# Patient Record
Sex: Female | Born: 1935 | Race: White | Hispanic: No
Health system: Southern US, Community
[De-identification: ages and names within clinical notes are randomized; demographics above are authoritative.]

---

## 2004-03-16 ENCOUNTER — Ambulatory Visit: Payer: Self-pay | Admitting: Family Medicine

## 2005-01-24 ENCOUNTER — Ambulatory Visit: Payer: Self-pay | Admitting: Family Medicine

## 2005-05-29 ENCOUNTER — Ambulatory Visit: Payer: Self-pay | Admitting: Family Medicine

## 2005-06-16 ENCOUNTER — Other Ambulatory Visit: Payer: Self-pay

## 2005-06-16 ENCOUNTER — Emergency Department: Payer: Self-pay | Admitting: Emergency Medicine

## 2006-06-05 ENCOUNTER — Ambulatory Visit: Payer: Self-pay | Admitting: Family Medicine

## 2006-12-12 IMAGING — CR DG ABDOMEN 3V
1 series · 4 of 4 positions shown · non-contrast
Comparison: none

REASON FOR EXAM: Abdominal pain ;rm 1
COMMENTS:

[Series 1: view not recorded · 0.17mm/px · 4 of 4 slices shown]
[im 1/4]
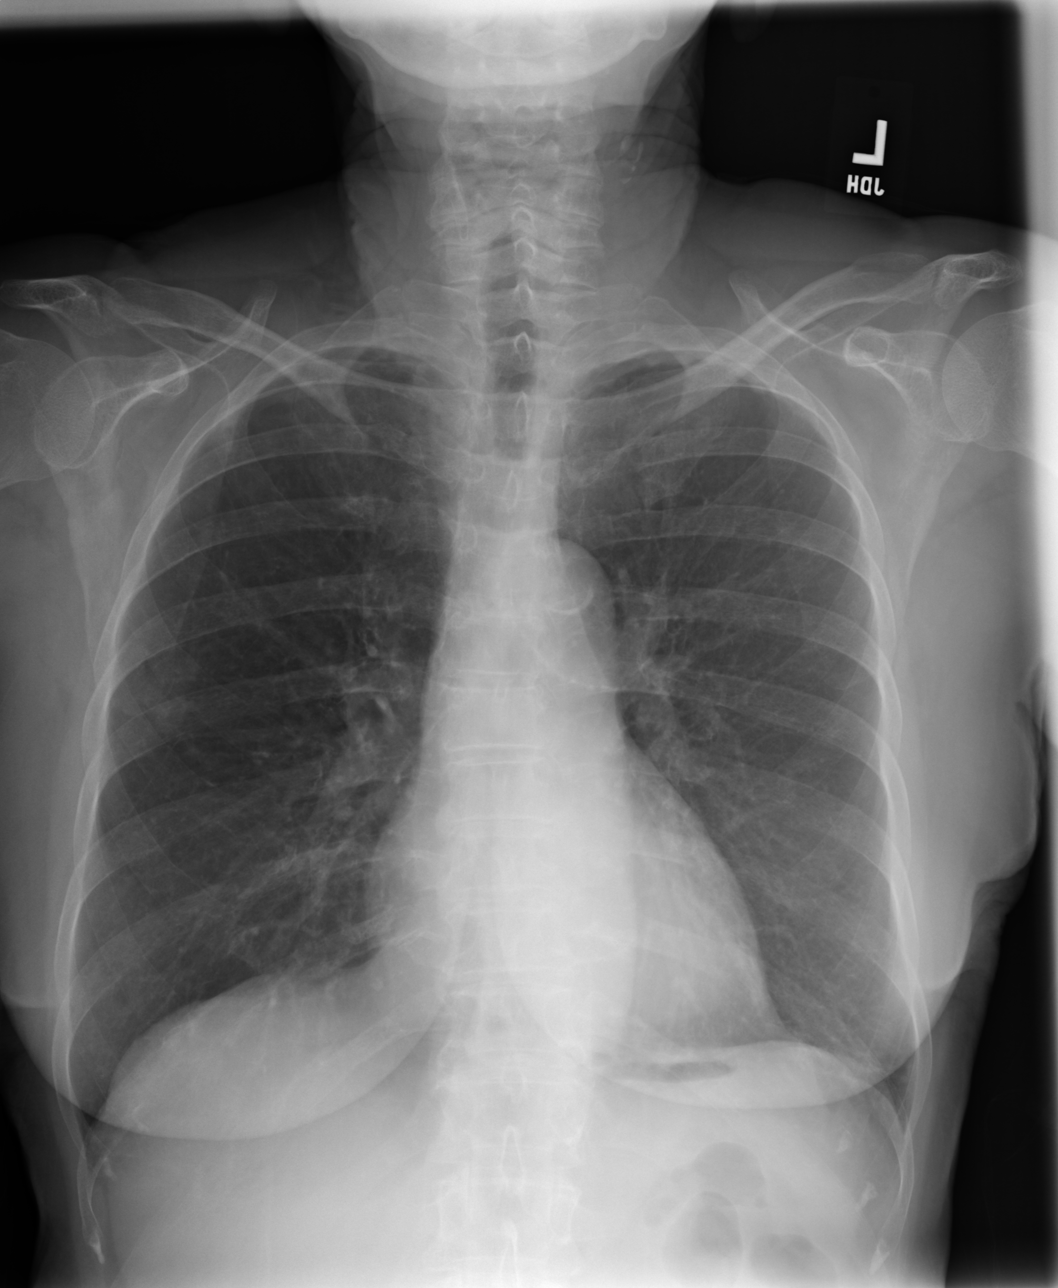
[im 2/4]
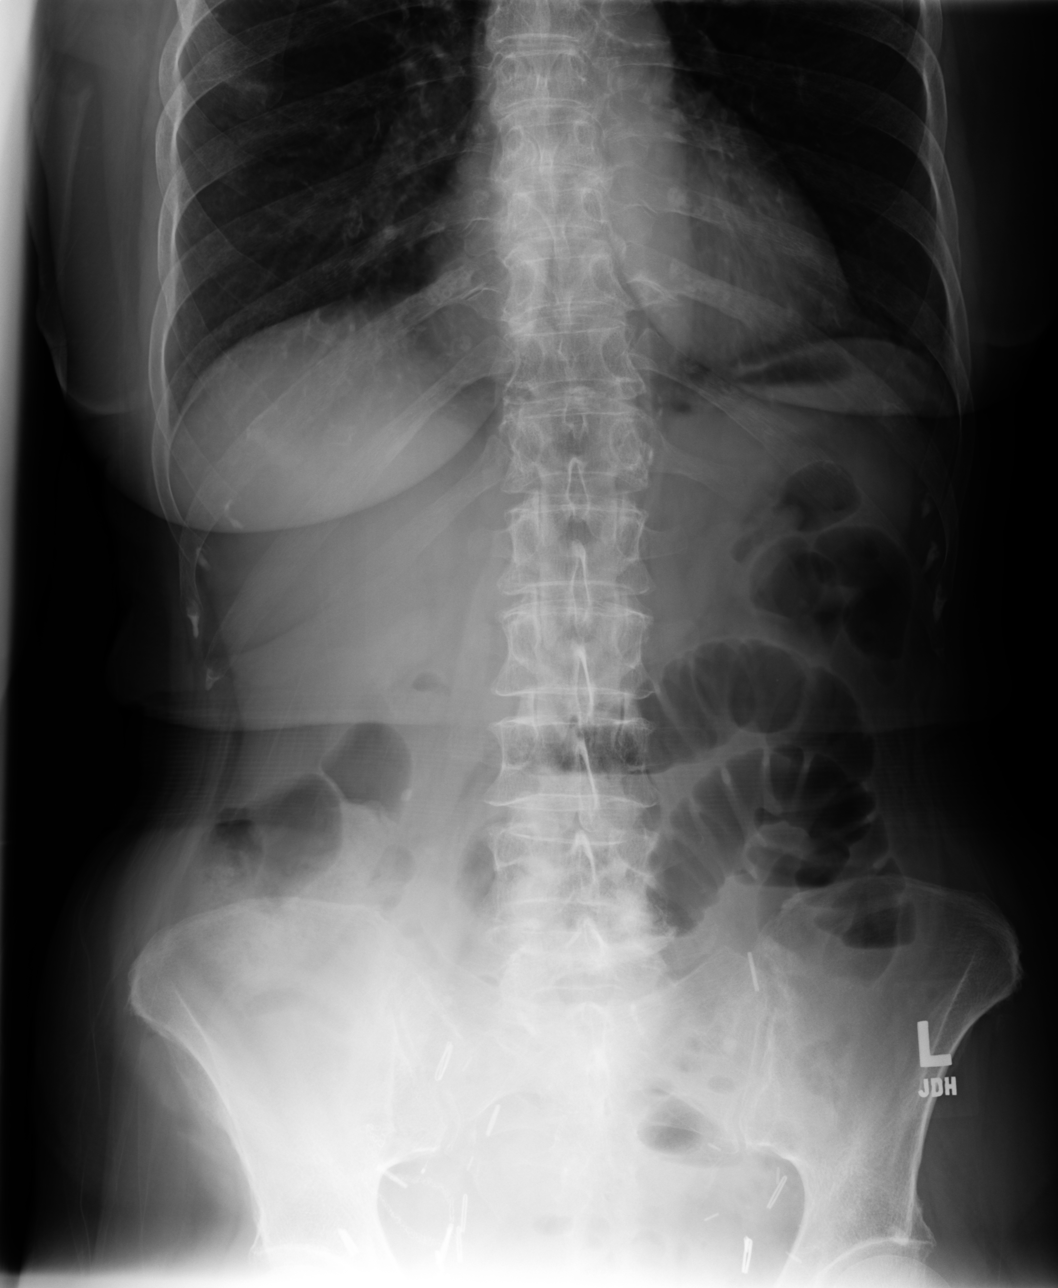
[im 3/4]
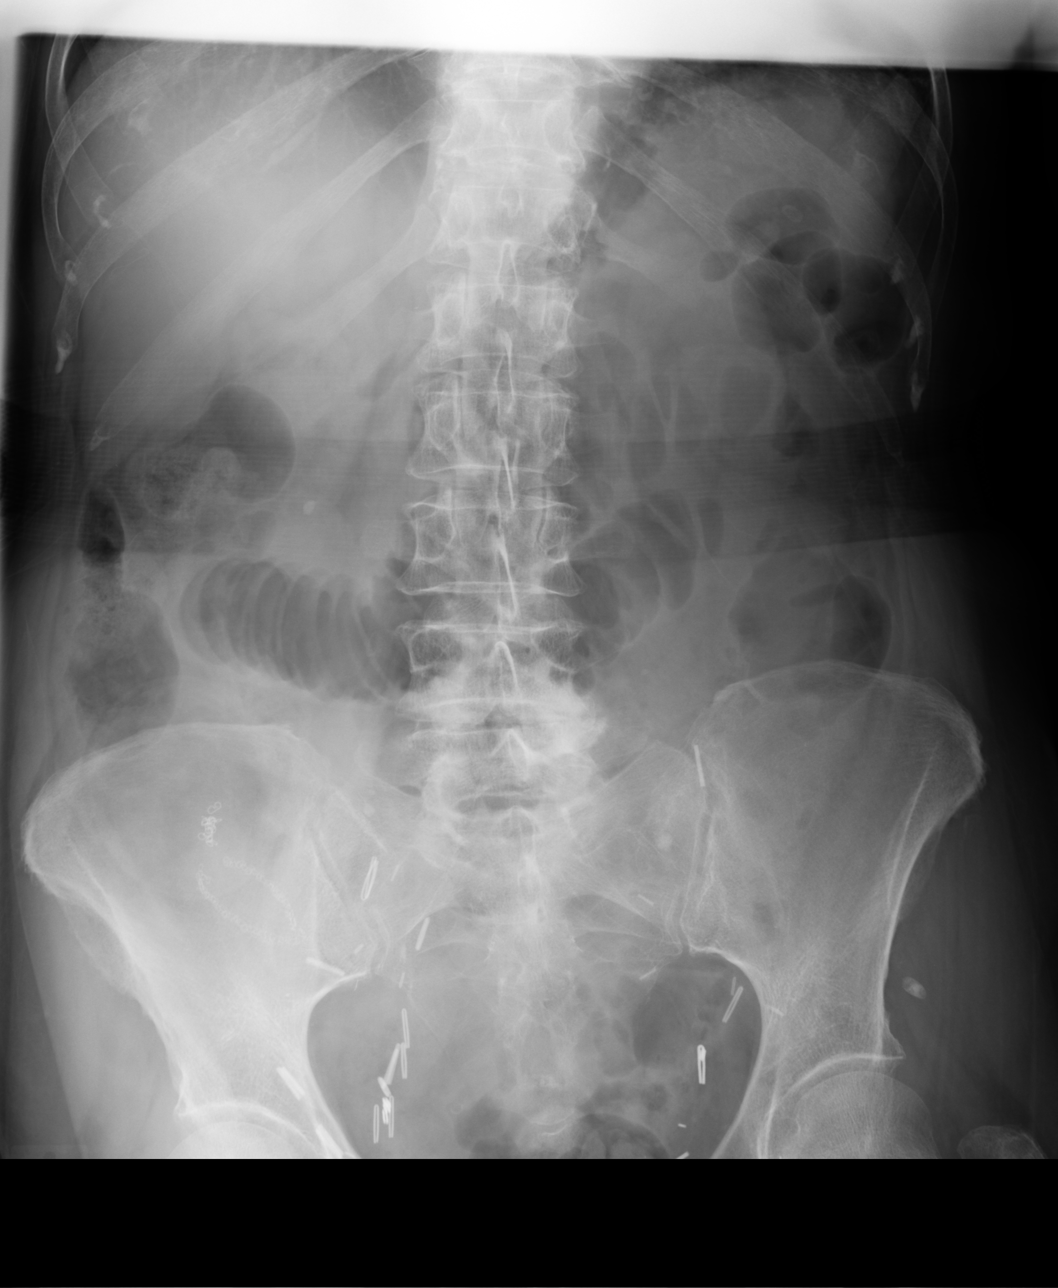
[im 4/4]
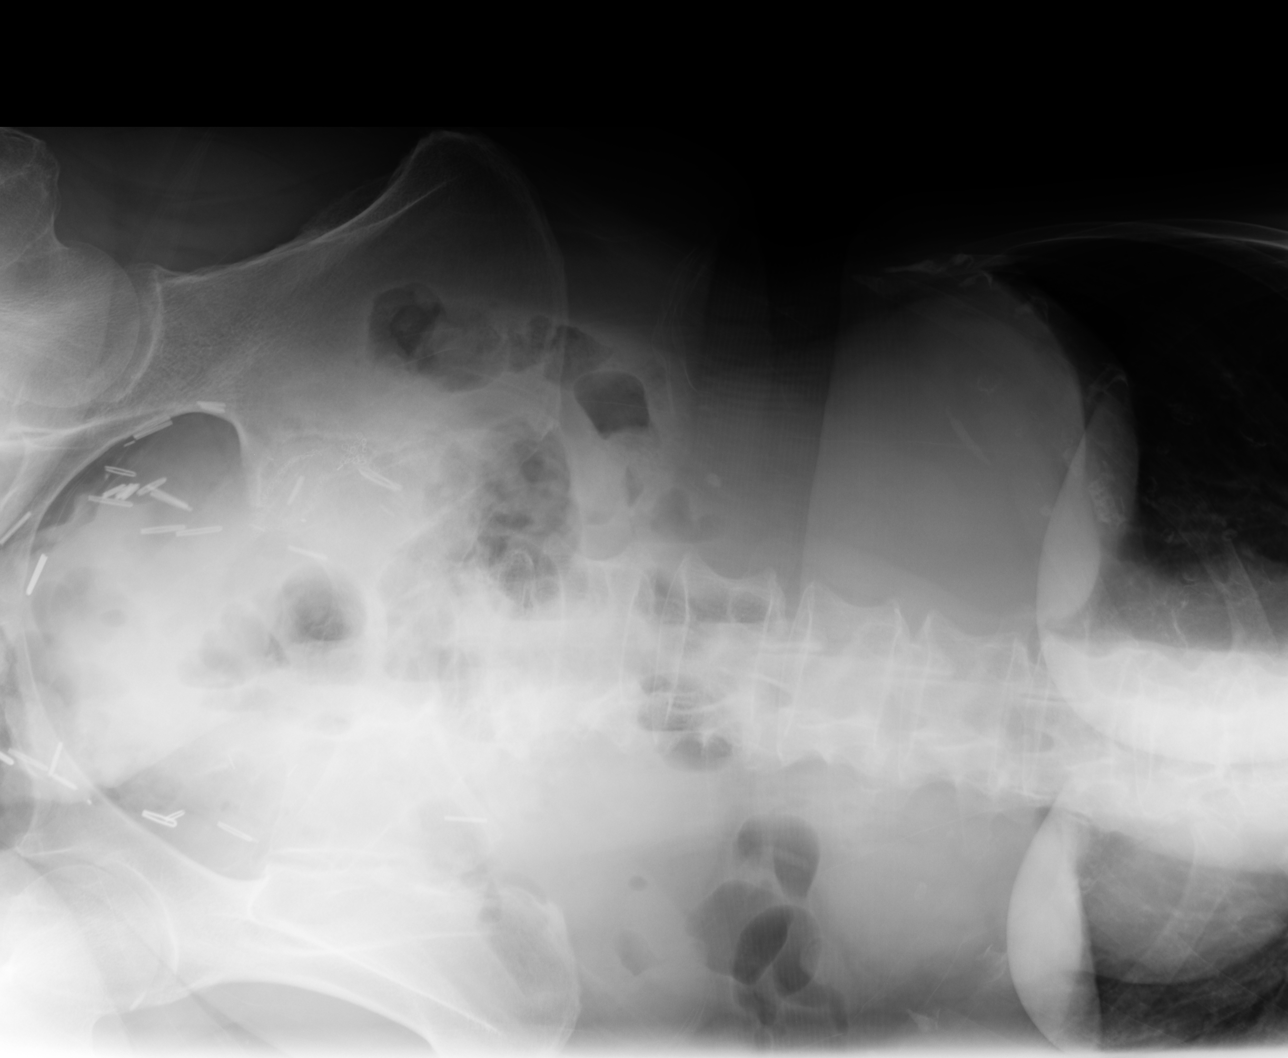

[4 of 4 positions shown; findings below may reference images not displayed]

PROCEDURE:     DXR - DXR ABDOMEN 3-WAY (INCL PA CXR)  - June 16, 2005  [DATE]

RESULT:      There is noted several loops of dilated small bowel in the
midabdomen with air fluid levels.   There is air in the colon down to the
rectosigmoid.   The findings may present an ileus.  No free intraperitoneal
air is noted.  Multiple surgical clips are noted in the pelvis bilaterally.
The accompanying chest film reveals the lung fields to be clear. No
effusions.
IMPRESSION: Findings which can represent an ileus.

## 2007-06-09 ENCOUNTER — Ambulatory Visit: Payer: Self-pay | Admitting: Family Medicine

## 2008-04-14 ENCOUNTER — Emergency Department: Payer: Self-pay | Admitting: Internal Medicine

## 2008-05-02 ENCOUNTER — Emergency Department: Payer: Self-pay | Admitting: Emergency Medicine

## 2008-07-09 ENCOUNTER — Ambulatory Visit: Payer: Self-pay | Admitting: Family Medicine

## 2008-07-20 ENCOUNTER — Ambulatory Visit: Payer: Self-pay | Admitting: Family Medicine

## 2009-08-02 ENCOUNTER — Ambulatory Visit: Payer: Self-pay | Admitting: Family Medicine

## 2009-09-29 ENCOUNTER — Ambulatory Visit: Payer: Self-pay | Admitting: Unknown Physician Specialty

## 2010-08-02 ENCOUNTER — Emergency Department: Payer: Self-pay | Admitting: Emergency Medicine

## 2010-08-17 ENCOUNTER — Ambulatory Visit: Payer: Self-pay | Admitting: Unknown Physician Specialty

## 2011-01-16 ENCOUNTER — Ambulatory Visit: Payer: Self-pay | Admitting: Unknown Physician Specialty

## 2011-04-08 ENCOUNTER — Emergency Department: Payer: Self-pay | Admitting: Emergency Medicine

## 2011-04-08 LAB — BASIC METABOLIC PANEL
Anion Gap: 7 (ref 7–16)
Calcium, Total: 9.6 mg/dL (ref 8.5–10.1)
Chloride: 100 mmol/L (ref 98–107)
Co2: 32 mmol/L (ref 21–32)
EGFR (African American): 48 — ABNORMAL LOW
Sodium: 139 mmol/L (ref 136–145)

## 2011-04-08 LAB — URINALYSIS, COMPLETE
Glucose,UR: NEGATIVE mg/dL (ref 0–75)
Nitrite: NEGATIVE
Protein: NEGATIVE
RBC,UR: 14 /HPF (ref 0–5)
Specific Gravity: 1.011 (ref 1.003–1.030)
WBC UR: 42 /HPF (ref 0–5)

## 2011-04-08 LAB — HEMOGLOBIN: HGB: 11.4 g/dL — ABNORMAL LOW (ref 12.0–16.0)

## 2011-04-10 LAB — URINE CULTURE

## 2011-04-11 ENCOUNTER — Ambulatory Visit: Payer: Self-pay | Admitting: Geriatric Medicine

## 2011-06-13 ENCOUNTER — Ambulatory Visit: Payer: Self-pay | Admitting: Internal Medicine

## 2011-06-28 ENCOUNTER — Observation Stay: Payer: Self-pay | Admitting: Specialist

## 2011-06-28 LAB — URINALYSIS, COMPLETE
Bilirubin,UR: NEGATIVE
Blood: NEGATIVE
Glucose,UR: NEGATIVE mg/dL (ref 0–75)
Hyaline Cast: 1
Leukocyte Esterase: NEGATIVE
Ph: 6 (ref 4.5–8.0)
Protein: 100
WBC UR: 24 /HPF (ref 0–5)

## 2011-06-28 LAB — CK TOTAL AND CKMB (NOT AT ARMC)
CK, Total: 48 U/L (ref 21–215)
CK, Total: 57 U/L (ref 21–215)
CK-MB: <0.5 ng/mL — ABNORMAL LOW (ref 0.5–3.6)

## 2011-06-28 LAB — CBC
HCT: 33.4 % — ABNORMAL LOW (ref 35.0–47.0)
HGB: 11.3 g/dL — ABNORMAL LOW (ref 12.0–16.0)
MCH: 32.5 pg (ref 26.0–34.0)
MCV: 96 fL (ref 80–100)
Platelet: 253 10*3/uL (ref 150–440)
RDW: 14.2 % (ref 11.5–14.5)
WBC: 7.9 10*3/uL (ref 3.6–11.0)

## 2011-06-28 LAB — TROPONIN I
Troponin-I: <0.02 ng/mL
Troponin-I: <0.02 ng/mL

## 2011-06-28 LAB — TSH: Thyroid Stimulating Horm: 0.198 u[IU]/mL — ABNORMAL LOW

## 2011-06-28 LAB — COMPREHENSIVE METABOLIC PANEL
Albumin: 3.5 g/dL (ref 3.4–5.0)
Alkaline Phosphatase: 95 U/L (ref 50–136)
Anion Gap: 6 — ABNORMAL LOW (ref 7–16)
Bilirubin,Total: 0.4 mg/dL (ref 0.2–1.0)
Calcium, Total: 8.7 mg/dL (ref 8.5–10.1)
Chloride: 101 mmol/L (ref 98–107)
Co2: 30 mmol/L (ref 21–32)
EGFR (African American): 34 — ABNORMAL LOW
EGFR (Non-African Amer.): 29 — ABNORMAL LOW
Glucose: 202 mg/dL — ABNORMAL HIGH (ref 65–99)
Potassium: 4.2 mmol/L (ref 3.5–5.1)
SGOT(AST): 24 U/L (ref 15–37)
Sodium: 137 mmol/L (ref 136–145)
Total Protein: 7.2 g/dL (ref 6.4–8.2)

## 2011-06-29 LAB — CBC WITH DIFFERENTIAL/PLATELET
Eosinophil #: 0.1 10*3/uL (ref 0.0–0.7)
Lymphocyte #: 1.7 10*3/uL (ref 1.0–3.6)
Lymphocyte %: 22.4 %
MCH: 32.2 pg (ref 26.0–34.0)
MCHC: 33.3 g/dL (ref 32.0–36.0)
Monocyte #: 0.8 x10 3/mm (ref 0.2–0.9)
Neutrophil #: 5 10*3/uL (ref 1.4–6.5)
Neutrophil %: 65 %
Platelet: 209 10*3/uL (ref 150–440)
RBC: 3.2 10*6/uL — ABNORMAL LOW (ref 3.80–5.20)

## 2011-06-29 LAB — BASIC METABOLIC PANEL
BUN: 36 mg/dL — ABNORMAL HIGH (ref 7–18)
Calcium, Total: 8.4 mg/dL — ABNORMAL LOW (ref 8.5–10.1)
Chloride: 105 mmol/L (ref 98–107)
Co2: 27 mmol/L (ref 21–32)
Creatinine: 1.27 mg/dL (ref 0.60–1.30)
EGFR (Non-African Amer.): 36 — ABNORMAL LOW
Osmolality: 288 (ref 275–301)
Sodium: 140 mmol/L (ref 136–145)

## 2011-06-29 LAB — CK TOTAL AND CKMB (NOT AT ARMC)
CK, Total: 53 U/L (ref 21–215)
CK-MB: <0.5 ng/mL — ABNORMAL LOW (ref 0.5–3.6)

## 2011-06-29 LAB — TROPONIN I: Troponin-I: <0.02 ng/mL

## 2011-07-14 ENCOUNTER — Ambulatory Visit: Payer: Self-pay | Admitting: Internal Medicine

## 2011-07-23 ENCOUNTER — Observation Stay: Payer: Self-pay | Admitting: Internal Medicine

## 2011-07-23 LAB — URINALYSIS, COMPLETE
Bilirubin,UR: NEGATIVE
Blood: NEGATIVE
Ketone: NEGATIVE
Ph: 6 (ref 4.5–8.0)
Squamous Epithelial: NONE SEEN

## 2011-07-23 LAB — COMPREHENSIVE METABOLIC PANEL
Albumin: 3.4 g/dL (ref 3.4–5.0)
Anion Gap: 9 (ref 7–16)
BUN: 53 mg/dL — ABNORMAL HIGH (ref 7–18)
Bilirubin,Total: 0.2 mg/dL (ref 0.2–1.0)
Chloride: 104 mmol/L (ref 98–107)
Creatinine: 2.19 mg/dL — ABNORMAL HIGH (ref 0.60–1.30)
Glucose: 153 mg/dL — ABNORMAL HIGH (ref 65–99)
Osmolality: 293 (ref 275–301)
Potassium: 4.9 mmol/L (ref 3.5–5.1)
SGOT(AST): 21 U/L (ref 15–37)
SGPT (ALT): 20 U/L
Sodium: 138 mmol/L (ref 136–145)
Total Protein: 7 g/dL (ref 6.4–8.2)

## 2011-07-23 LAB — CBC
HCT: 32 % — ABNORMAL LOW (ref 35.0–47.0)
HGB: 10.7 g/dL — ABNORMAL LOW (ref 12.0–16.0)
MCH: 32.1 pg (ref 26.0–34.0)
MCV: 96 fL (ref 80–100)
Platelet: 215 10*3/uL (ref 150–440)
RDW: 13.5 % (ref 11.5–14.5)
WBC: 6 10*3/uL (ref 3.6–11.0)

## 2011-07-23 LAB — TROPONIN I: Troponin-I: <0.02 ng/mL

## 2011-07-23 LAB — CK TOTAL AND CKMB (NOT AT ARMC): CK, Total: 58 U/L (ref 21–215)

## 2011-07-24 LAB — CBC WITH DIFFERENTIAL/PLATELET
Basophil #: 0 10*3/uL (ref 0.0–0.1)
Eosinophil %: 3.1 %
HCT: 29.9 % — ABNORMAL LOW (ref 35.0–47.0)
Lymphocyte #: 1.4 10*3/uL (ref 1.0–3.6)
MCHC: 32.9 g/dL (ref 32.0–36.0)
Monocyte #: 0.5 x10 3/mm (ref 0.2–0.9)
Neutrophil %: 52.4 %
RBC: 3.06 10*6/uL — ABNORMAL LOW (ref 3.80–5.20)
RDW: 13.5 % (ref 11.5–14.5)

## 2011-07-24 LAB — BASIC METABOLIC PANEL
Co2: 20 mmol/L — ABNORMAL LOW (ref 21–32)
EGFR (African American): 32 — ABNORMAL LOW
Glucose: 86 mg/dL (ref 65–99)
Osmolality: 295 (ref 275–301)
Potassium: 4.7 mmol/L (ref 3.5–5.1)

## 2011-07-24 LAB — TSH: Thyroid Stimulating Horm: 0.155 u[IU]/mL — ABNORMAL LOW

## 2011-07-24 LAB — HEMOGLOBIN A1C: Hemoglobin A1C: 6 % (ref 4.2–6.3)

## 2012-05-02 ENCOUNTER — Inpatient Hospital Stay: Payer: Self-pay | Admitting: Internal Medicine

## 2012-05-02 LAB — BASIC METABOLIC PANEL
Anion Gap: 6 — ABNORMAL LOW (ref 7–16)
BUN: 49 mg/dL — ABNORMAL HIGH (ref 7–18)
Calcium, Total: 8.7 mg/dL (ref 8.5–10.1)
Creatinine: 2.46 mg/dL — ABNORMAL HIGH (ref 0.60–1.30)
EGFR (African American): 19 — ABNORMAL LOW
EGFR (Non-African Amer.): 16 — ABNORMAL LOW
Glucose: 150 mg/dL — ABNORMAL HIGH (ref 65–99)
Osmolality: 293 (ref 275–301)
Potassium: 4.7 mmol/L (ref 3.5–5.1)
Sodium: 139 mmol/L (ref 136–145)

## 2012-05-02 LAB — CBC
HCT: 33.1 % — ABNORMAL LOW (ref 35.0–47.0)
MCHC: 33.3 g/dL (ref 32.0–36.0)
MCV: 99 fL (ref 80–100)
RDW: 12.4 % (ref 11.5–14.5)

## 2012-05-02 LAB — URINALYSIS, COMPLETE
Blood: NEGATIVE
Glucose,UR: NEGATIVE mg/dL (ref 0–75)
Ketone: NEGATIVE
Nitrite: NEGATIVE
Protein: 30
Squamous Epithelial: <1
WBC UR: 18 /HPF (ref 0–5)

## 2012-05-03 LAB — CBC WITH DIFFERENTIAL/PLATELET
Basophil #: 0 10*3/uL (ref 0.0–0.1)
Basophil %: 0.6 %
Eosinophil #: 0.2 10*3/uL (ref 0.0–0.7)
Eosinophil %: 2.3 %
HCT: 29.8 % — ABNORMAL LOW (ref 35.0–47.0)
HGB: 10.1 g/dL — ABNORMAL LOW (ref 12.0–16.0)
MCH: 33 pg (ref 26.0–34.0)
MCHC: 33.8 g/dL (ref 32.0–36.0)
MCV: 98 fL (ref 80–100)
Monocyte #: 0.9 x10 3/mm (ref 0.2–0.9)
Neutrophil #: 5.3 10*3/uL (ref 1.4–6.5)
Neutrophil %: 69.4 %
RBC: 3.05 10*6/uL — ABNORMAL LOW (ref 3.80–5.20)
RDW: 12.2 % (ref 11.5–14.5)

## 2012-05-03 LAB — BASIC METABOLIC PANEL
Anion Gap: 9 (ref 7–16)
Calcium, Total: 8.3 mg/dL — ABNORMAL LOW (ref 8.5–10.1)
Chloride: 110 mmol/L — ABNORMAL HIGH (ref 98–107)
Creatinine: 2.02 mg/dL — ABNORMAL HIGH (ref 0.60–1.30)
EGFR (African American): 24 — ABNORMAL LOW
EGFR (Non-African Amer.): 20 — ABNORMAL LOW
Glucose: 86 mg/dL (ref 65–99)
Sodium: 144 mmol/L (ref 136–145)

## 2012-05-03 LAB — WBCS, STOOL

## 2012-05-03 LAB — CLOSTRIDIUM DIFFICILE BY PCR

## 2012-05-05 LAB — BASIC METABOLIC PANEL
Anion Gap: 8 (ref 7–16)
BUN: 39 mg/dL — ABNORMAL HIGH (ref 7–18)
Calcium, Total: 7.7 mg/dL — ABNORMAL LOW (ref 8.5–10.1)
Chloride: 115 mmol/L — ABNORMAL HIGH (ref 98–107)
Co2: 21 mmol/L (ref 21–32)
EGFR (African American): 33 — ABNORMAL LOW
EGFR (Non-African Amer.): 28 — ABNORMAL LOW
Glucose: 151 mg/dL — ABNORMAL HIGH (ref 65–99)

## 2012-05-06 ENCOUNTER — Encounter: Payer: Self-pay | Admitting: Internal Medicine

## 2012-05-06 LAB — URINE CULTURE

## 2012-05-13 ENCOUNTER — Encounter: Payer: Self-pay | Admitting: Internal Medicine

## 2012-05-23 LAB — URINALYSIS, COMPLETE
Glucose,UR: NEGATIVE mg/dL (ref 0–75)
Ketone: NEGATIVE
Leukocyte Esterase: NEGATIVE
Nitrite: NEGATIVE
RBC,UR: 8 /HPF (ref 0–5)
Specific Gravity: 1.014 (ref 1.003–1.030)
Squamous Epithelial: NONE SEEN
WBC UR: 8 /HPF (ref 0–5)

## 2012-05-27 LAB — URINE CULTURE

## 2012-06-24 ENCOUNTER — Emergency Department: Payer: Self-pay | Admitting: Emergency Medicine

## 2012-07-13 ENCOUNTER — Ambulatory Visit: Payer: Self-pay | Admitting: Internal Medicine

## 2012-07-23 ENCOUNTER — Observation Stay: Payer: Self-pay | Admitting: Internal Medicine

## 2012-07-23 LAB — COMPREHENSIVE METABOLIC PANEL
Albumin: 3.2 g/dL — ABNORMAL LOW (ref 3.4–5.0)
Anion Gap: 8 (ref 7–16)
Bilirubin,Total: 0.1 mg/dL — ABNORMAL LOW (ref 0.2–1.0)
Glucose: 112 mg/dL — ABNORMAL HIGH (ref 65–99)
Potassium: 5.2 mmol/L — ABNORMAL HIGH (ref 3.5–5.1)
SGOT(AST): 37 U/L (ref 15–37)
SGPT (ALT): 26 U/L (ref 12–78)
Total Protein: 7.1 g/dL (ref 6.4–8.2)

## 2012-07-23 LAB — TROPONIN I: Troponin-I: <0.02 ng/mL

## 2012-07-23 LAB — URINALYSIS, COMPLETE
Hyaline Cast: 1
Nitrite: NEGATIVE
Ph: 9 (ref 4.5–8.0)
Specific Gravity: 1.014 (ref 1.003–1.030)
Squamous Epithelial: <1
WBC UR: 9 /HPF (ref 0–5)

## 2012-07-23 LAB — CBC WITH DIFFERENTIAL/PLATELET
Basophil %: 1.4 %
Lymphocyte #: 1.9 10*3/uL (ref 1.0–3.6)
Lymphocyte %: 24.7 %
MCH: 31.5 pg (ref 26.0–34.0)
Neutrophil %: 55.6 %
Platelet: 246 10*3/uL (ref 150–440)
RDW: 12.6 % (ref 11.5–14.5)
WBC: 7.5 10*3/uL (ref 3.6–11.0)

## 2012-07-24 LAB — BASIC METABOLIC PANEL
BUN: 50 mg/dL — ABNORMAL HIGH (ref 7–18)
Chloride: 108 mmol/L — ABNORMAL HIGH (ref 98–107)
Creatinine: 2.27 mg/dL — ABNORMAL HIGH (ref 0.60–1.30)
EGFR (Non-African Amer.): 18 — ABNORMAL LOW
Glucose: 97 mg/dL (ref 65–99)
Osmolality: 295 (ref 275–301)
Potassium: 4.5 mmol/L (ref 3.5–5.1)
Sodium: 141 mmol/L (ref 136–145)

## 2012-07-24 LAB — CBC WITH DIFFERENTIAL/PLATELET
Basophil #: 0.1 10*3/uL (ref 0.0–0.1)
Eosinophil #: 0.4 10*3/uL (ref 0.0–0.7)
HCT: 27.8 % — ABNORMAL LOW (ref 35.0–47.0)
HGB: 9.5 g/dL — ABNORMAL LOW (ref 12.0–16.0)
Lymphocyte #: 1.4 10*3/uL (ref 1.0–3.6)
MCHC: 34.2 g/dL (ref 32.0–36.0)
MCV: 94 fL (ref 80–100)
Monocyte #: 0.8 x10 3/mm (ref 0.2–0.9)
Monocyte %: 10.5 %
WBC: 7.7 10*3/uL (ref 3.6–11.0)

## 2012-07-24 LAB — TSH: Thyroid Stimulating Horm: 0.122 u[IU]/mL — ABNORMAL LOW

## 2012-07-24 LAB — MAGNESIUM: Magnesium: 1.8 mg/dL

## 2012-07-25 LAB — URINE CULTURE

## 2012-07-25 LAB — BASIC METABOLIC PANEL
Anion Gap: 7 (ref 7–16)
BUN: 37 mg/dL — ABNORMAL HIGH (ref 7–18)
Calcium, Total: 8.4 mg/dL — ABNORMAL LOW (ref 8.5–10.1)
Co2: 27 mmol/L (ref 21–32)
EGFR (African American): 35 — ABNORMAL LOW
EGFR (Non-African Amer.): 30 — ABNORMAL LOW
Glucose: 82 mg/dL (ref 65–99)
Osmolality: 295 (ref 275–301)
Sodium: 144 mmol/L (ref 136–145)

## 2012-08-12 ENCOUNTER — Ambulatory Visit: Payer: Self-pay | Admitting: Internal Medicine

## 2012-09-12 DEATH — deceased

## 2013-10-28 IMAGING — CT CT HEAD WITHOUT CONTRAST
1 series · 16 of 30 positions shown, 20 images · non-contrast
Comparison: none

REASON FOR EXAM: fall, head injury
COMMENTS:

PROCEDURE:     CT  - CT HEAD WITHOUT CONTRAST  - May 02, 2012  [DATE]
RESULT:     Comparison:  06/28/2011
TECHNIQUE: Multiple axial images from the foramen magnum to the vertex were
obtained without IV contrast.

[Series 2: soft tissue · axial · 0.40mm/px · z∈[-166,-16]mm · 16 of 34 slices shown, 20 images]
[im 2/34  brain]
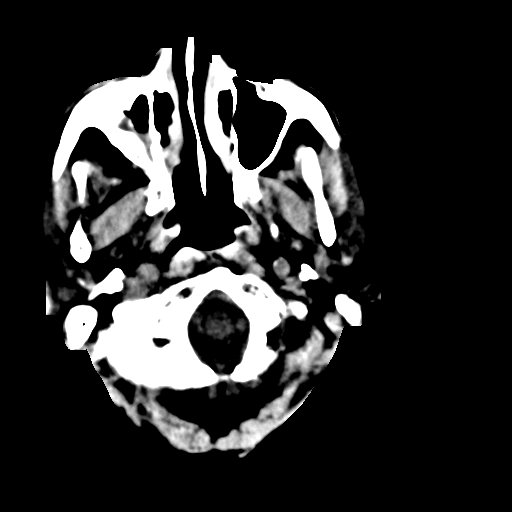
[im 2/34  bone]
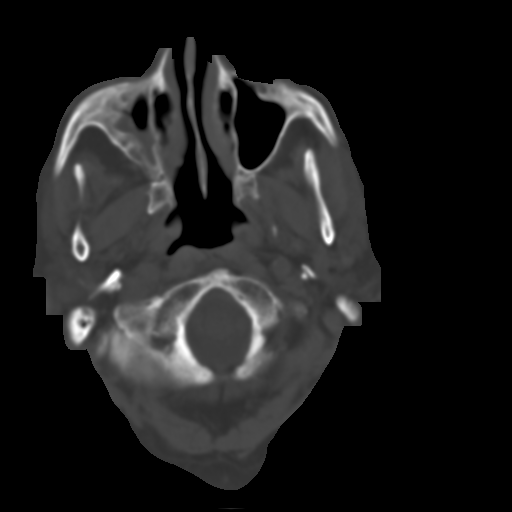
[im 4/34  brain]
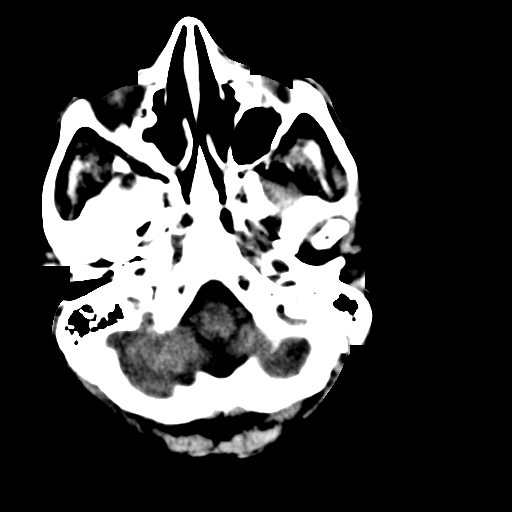
[im 6/34  brain]
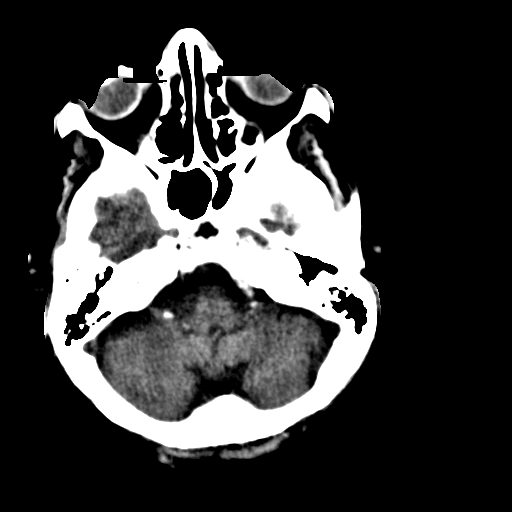
[im 8/34  brain]
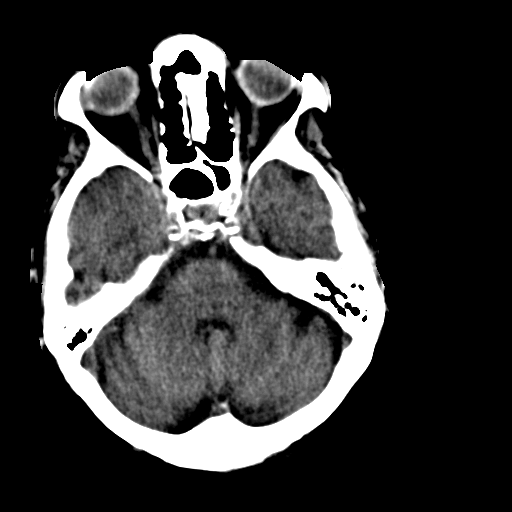
[im 10/34  brain]
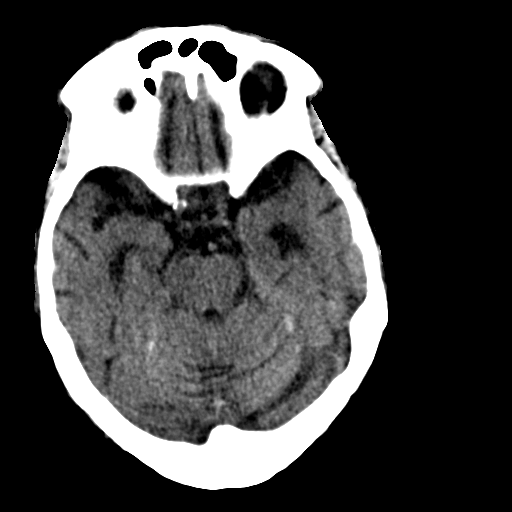
[im 10/34  bone]
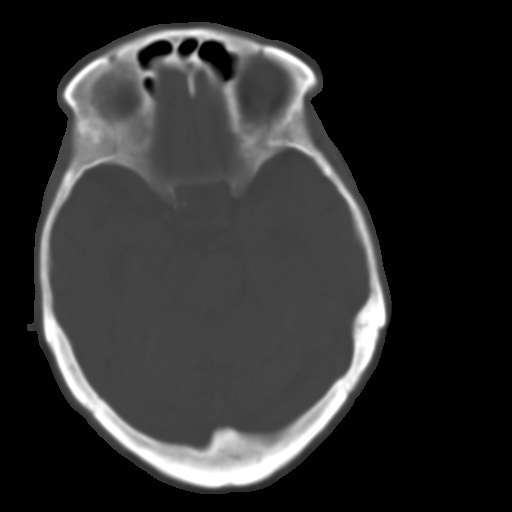
[im 12/34  brain]
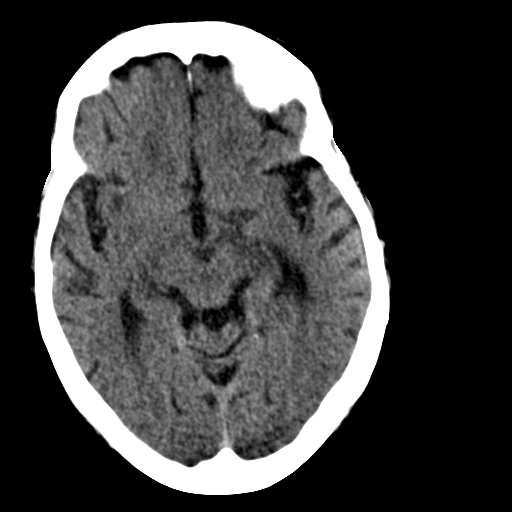
[im 14/34  brain]
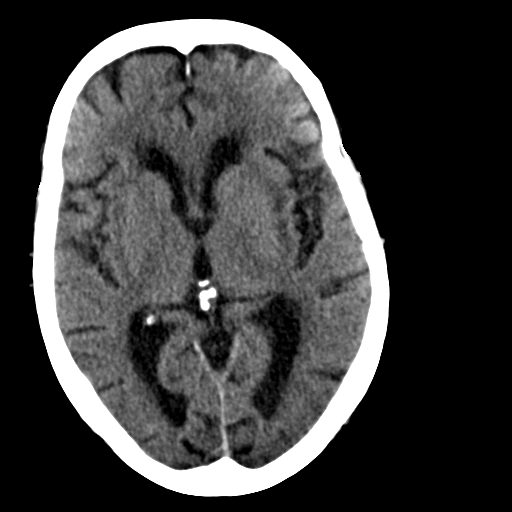
[im 16/34  brain]
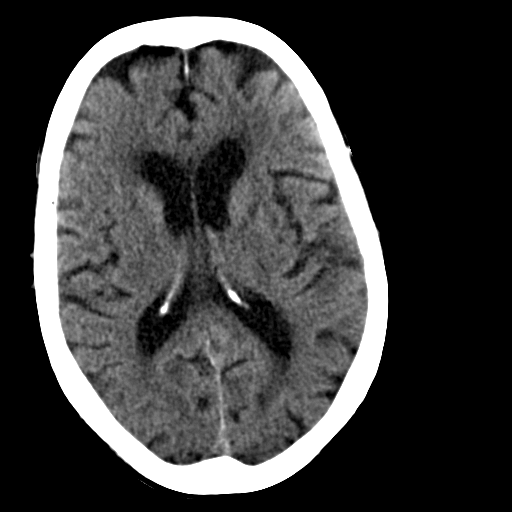
[im 18/34  brain]
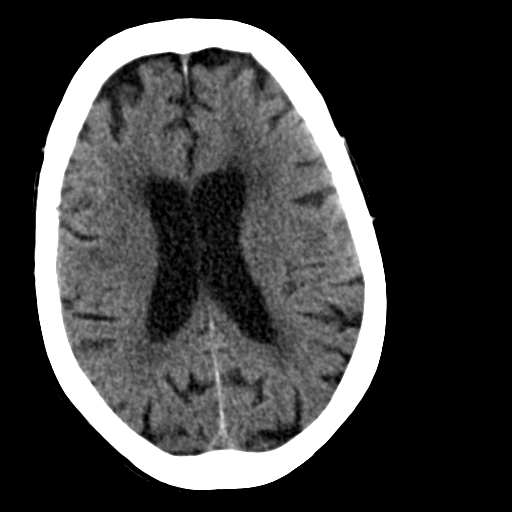
[im 18/34  bone]
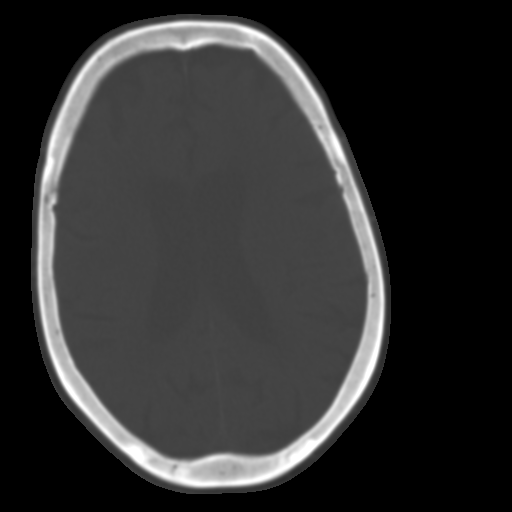
[im 20/34  brain]
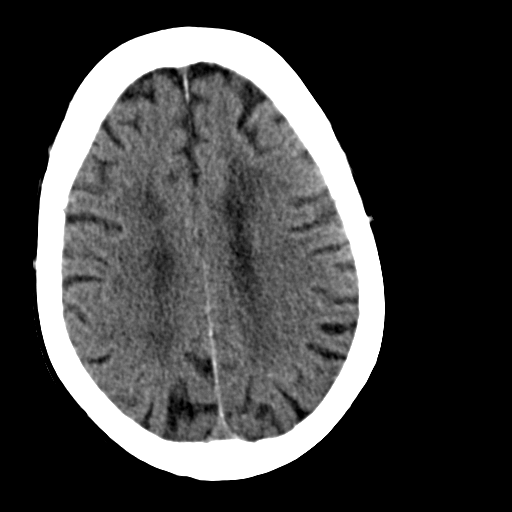
[im 22/34  brain]
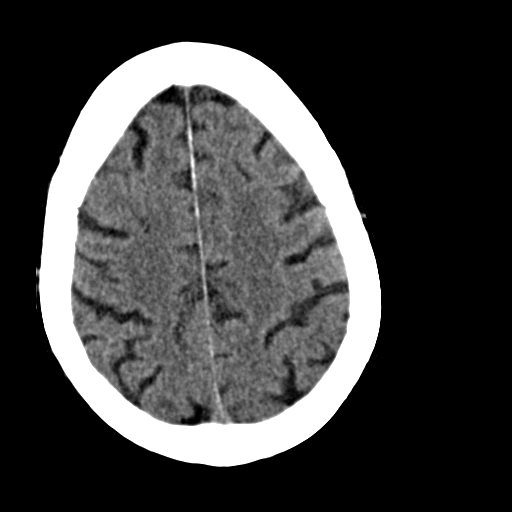
[im 24/34  brain]
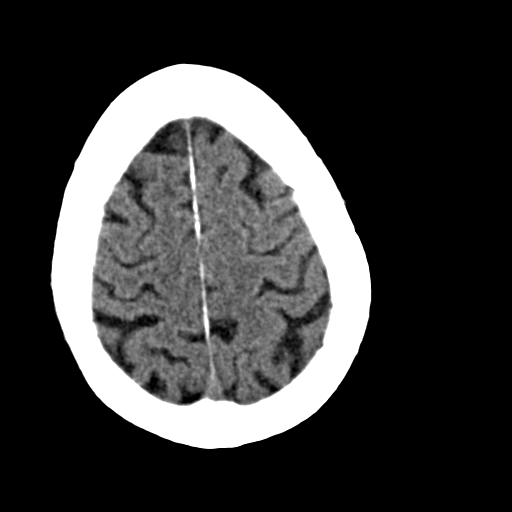
[im 26/34  brain]
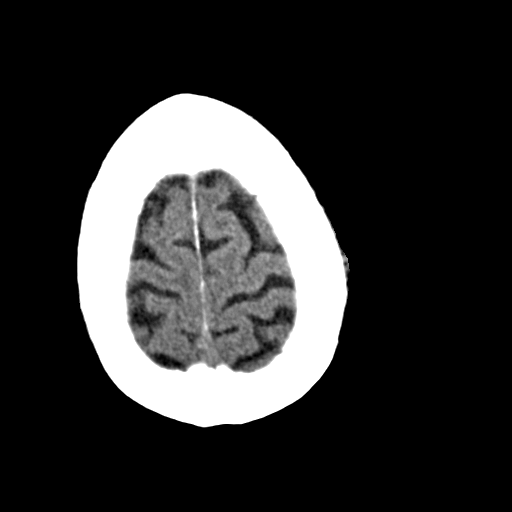
[im 26/34  bone]
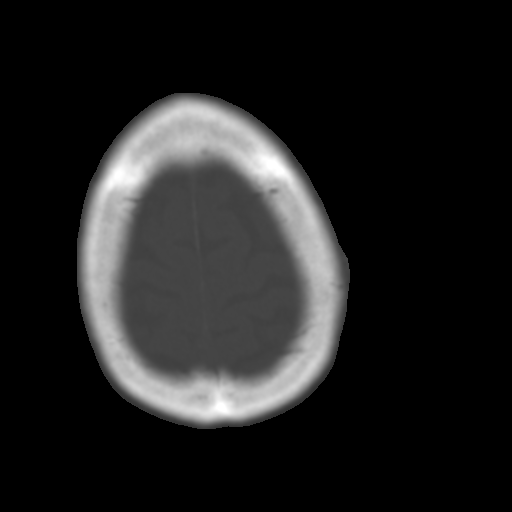
[im 28/34  brain]
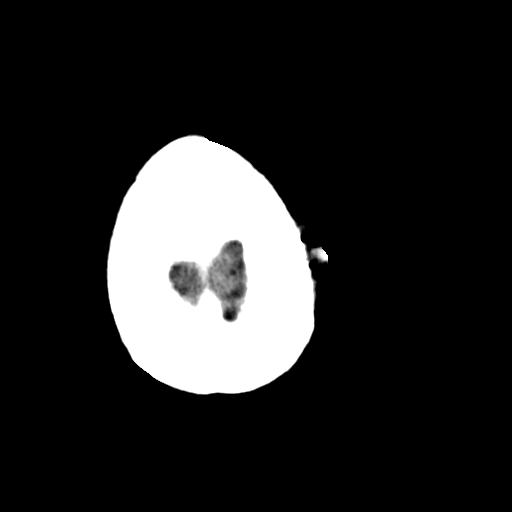
[im 30/34  brain]
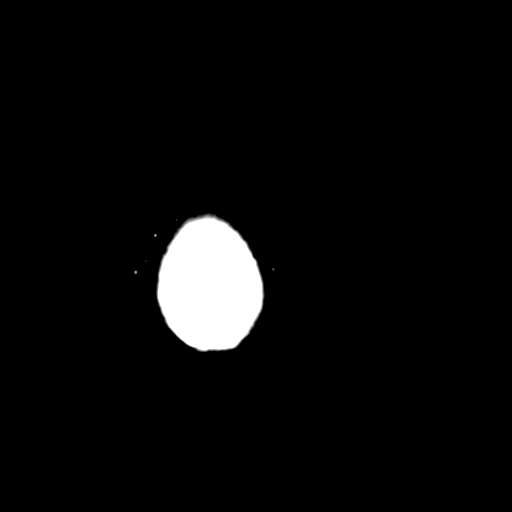
[im 32/34  brain]
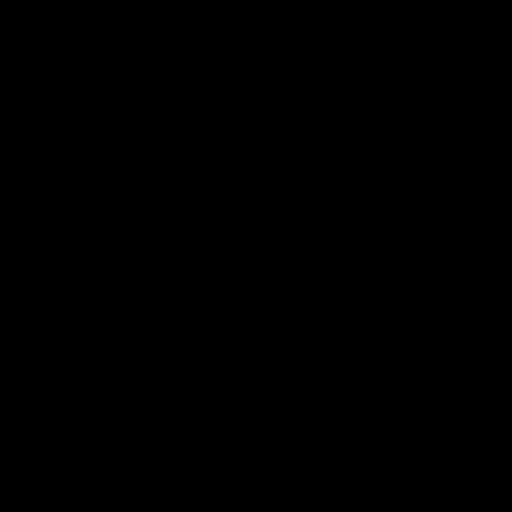

[16 of 30 positions shown; findings below may reference images not displayed]

FINDINGS: There is no evidence of mass effect, midline shift, or extra-axial fluid
collections.  There is no evidence of a space-occupying lesion or
intracranial hemorrhage. There is no evidence of a cortical-based area of
acute infarction. There is generalized cerebral atrophy. There is
periventricular white matter low attenuation likely secondary to
microangiopathy.

The ventricles and sulci are appropriate for the patient's age. The basal
cisterns are patent.

Visualized portions of the orbits are unremarkable. There is right maxillary
sinus mucosal thickening. The right maxillary sinus is hypoplastic.
Cerebrovascular atherosclerotic calcifications are noted.

The osseous structures are unremarkable.
IMPRESSION: No acute intracranial process.

[REDACTED]

## 2014-06-04 NOTE — Discharge Summary (Signed)
PATIENT NAME:  [name], [name]#:  [id number] DATE OF BIRTH:  March 19, 1915  DATE OF ADMISSION:  05/02/2012 DATE OF DISCHARGE: 05/06/2012    The patient is being discharged to rehab.   PRESENTING COMPLAINT: Diarrhea and generalized weakness.   DISCHARGE DIAGNOSES:  1.  Clostridium difficile colitis.  2.  Acute prerenal azotemia, improved.  3.  Acute on chronic renal failure. The patient has CKD stage III. Baseline creatinine around 1.5.  4.  History of chronic urostomy achieved secondary to bladder cancer.  5.  Anxiety and depression.  6.  Hypertension.  7.  Hypothyroidism.   CONDITION ON DISCHARGE: Fair.   CODE STATUS: Full code.   DIET: Regular soft diet.   Physical therapy. Urostomy care per protocol.   MEDICATIONS:  1.  Alprazolam 0.25 mg q. 8 hours p.r.n.  2.  Hydralazine 25 mg b.i.d.  3.  Remeron 7.5 mg at bedtime.  4.  Levothyroxine 0.075 mg p.o. daily.  5.  Namenda 10 mg b.i.d.  6.  Aricept 5 mg at bedtime.  7.  Tenormin 25 mg daily.  8.  Flagyl 500 mg p.o. t.i.d. for 6 more days.  9.  Tylenol 650 mg p.o. every 4 hours p.r.n.  10. Aspirin 81 mg daily.  11. Lexapro 10 mg daily.   CONSULTATIONS: PT/OT. Physical therapy.   LABS AT DISCHARGE: Creatinine 1.5, glucose 151, BUN is 39, sodium 144, potassium 4.2. Stool for C. diff. is positive. Stool WBC more than 30. WBCs seen. White count is 7.7, hemoglobin and hematocrit 10.1 and 29.8 and platelet count is 160. Glucose is 86. Calcium is 8.3. UA 2+ bacteria, 1+ leukocyte esterase. Positive for more than 100,000 Enterococcus faecalis. White count is 9.6.   BRIEF SUMMARY OF HOSPITAL COURSE: The patient is 81 year old Caucasian female with history of bladder cancer, who has a chronic urostomy tube placement. She comes to the Emergency Room with increasing weakness and significant diarrhea. She was admitted with:  1.  Diarrhea, acute onset, which tested positive for C. difficile colitis. She was started on p.o. Flagyl, which she  tolerated well along with yogurt twice a day. Her diarrhea slowed down. She did have some small  stools prior to discharge. She was tolerating oral diet as well.  2.  Urinary tract infection. The patient does have nocturia. She has true infection versus colonization now that urine culture is growing Enterococcus faecalis. It is pansensitive at this time. She has a urostomy tube. Her white count is normal. Will give the patient a course of nitrofurantoin for 7 days.  3.  Hypertension. Atenolol and hydralazine were resumed.  4.  Acute on chronic renal failure. The patient received IV fluids. Her creatinine is back to her baseline which is 1.5.  5.  Fall with generalized weakness, likely from dehydration. PT evaluated the patient, who recommends rehab. Family agreeable. The patient will be sent for rehabilitation today.   TIME SPENT: 40 minutes.   ____________________________ Wylie HailSona A. Allena KatzPatel, MD sap:aw D: 05/06/2012 13:14:32 ET T: 05/06/2012 14:13:08 ET JOB#: [id number]  cc: Cammi Consalvo A. Allena KatzPatel, MD, <Dictator> Willow OraSONA A Skip Litke MD ELECTRONICALLY SIGNED 05/10/2012 13:08

## 2014-06-04 NOTE — H&P (Signed)
DATE OF BIRTH:  1915-02-17  DATE OF ADMISSION:  05/02/2012   CHIEF COMPLAINT:  Fall, confusion.   HISTORY OF PRESENT ILLNESS:  A 81 year old Caucasian female patient with history of hypertension, urostomy secondary to bladder cancer, who presented to the Emergency Room from assisted living facility after she was found in the bathroom fallen down. The patient was conscious, but weak and confused. The patient has had confusion with weakness over the last 3 days. Was at urgent care yesterday, where she was diagnosed with otitis media, was started on azithromycin. The patient also had diarrhea for 3 days, and was started on Imodium.   Today in the Emergency Room, patient has fever of 100.8, with urinalysis from the urostomy back showing UTI. No diarrhea in the hospital here. The patient also has acute renal failure, with creatinine elevated at 2.47, and is being admitted to the hospitalist service with acute encephalopathy, UTI, acute renal failure.   Her daughter gives most of the history, patient is confused, extremely weak. Is not oriented to place or time, but is oriented to person. Is hard of hearing.   REVIEW OF SYSTEMS:  Unobtainable due to confusion.   PAST MEDICAL HISTORY:  1.  Hypertension. 2.  Hypothyroidism. 3.  Bladder cancer, status post urostomy. 4.  Anxiety/depression.  5.  CKD stage III, with baseline creatinine around 1.5.   PAST SURGICAL HISTORY:  1.  Urostomy for bladder cancer.  2.  Cataract surgery.  3.  Esophageal stricture, status post dilation.   ALLERGIES:  Drug allergies to SULFA and PHENERGAN.   SOCIAL HISTORY: The patient lives at [facility] assisted living facility. Does not smoke. No alcohol. No illicit drugs.   CODE STATUS:  FULL CODE.   FAMILY HISTORY:  Dad died of tuberculosis working in the mines. Mother died in her 12s.   HOME MEDICATIONS INCLUDE: 1.  Acetaminophen 650 mg oral every 4 hours as needed for pain.  2.  Alprazolam 0.25 mg oral  every 8 hours as needed for anxiety.  3.  Aspirin 81 mg daily.  4.  Atenolol 25 mg daily.  5.  Chondroitin glucosamine 1 capsule oral daily.  6.  Donepezil 5 mg oral daily.  7.  Escitalopram 20 mg oral daily.  8.  Hydralazine 25 mg oral 2 times a day.  9.  Levothyroxine 75 mcg oral once a day.  10.  Mirtazapine 15 mg 1/2 tablet oral once a day at bedtime.  11.  Multivitamin 1 tablet oral once a day.  12.  Namenda 10 mg oral 2 times a day.  13.  Os-Cal 500, 1 tablet oral once a day.  14.  Trimethoprim 100 mg oral once a day   PHYSICAL EXAMINATION: VITAL SIGNS:  Temperature 98.4, pulse of 71, blood pressure 116/55, saturating 90% on room air, temperature of 100.8 on repeat check.  GENERAL:  Elderly Caucasian female patient lying in bed. Calm, cooperative with exam.  PSYCHIATRIC:  Alert, awake, but not oriented to time or place. Oriented to person. Has a flat, depressed affect.  HEENT: Has some bruising around the left eye. Laceration in the back of the head. Normocephalic. Pupils bilaterally equal and react to light. No pallor. No icterus. NECK:  Supple. No thyromegaly. No palpable lymph nodes. Trachea midline. No carotid bruit, JVD.  CARDIOVASCULAR:  S1, S2. Systolic murmur. Peripheral pulses 2+.  RESPIRATORY:  Normal work of breathing. Clear to auscultation on both sides.  GASTROINTESTINAL:  Soft abdomen, nontender. Bowel sounds  present,  and a urostomy bag. No hepatosplenomegaly palpable.  GENITOURINARY:  Urostomy bag with dark urine. No CVA tenderness.  SKIN:  Warm and dry. No petechiae, rash, ulcers.  MUSCULOSKELETAL:  No joint swelling, redness, effusion of the large joints. Normal muscle tone.  NEUROLOGICAL:  Motor strength 4/5 in upper and lower extremities. Sensation to fine touch intact all over.  LYMPHATIC:  No cervical lymphadenopathy.   LAB STUDIES:  Show glucose 150, BUN 49, creatinine 2.46, sodium 139, potassium 4.7. WBC 9.6, hemoglobin 11, platelets of 166. Urinalysis shows  10-20  WBC, 2+ bacteria.   Chest x-ray shows no acute abnormalities.   CT scan of the head without contrast shows no acute intracranial process along with right maxillary sinus mucosal thickening.   EKG not done in the Emergency Room.   ASSESSMENT AND PLAN: 1.  Urinary tract infection.  The patient will be started on ceftriaxone. Get urine cultures and blood cultures. The patient does have a urostomy bag, which could have contaminated, but will wait for final cultures and treat the patient.  2.  Acute renal failure due to dehydration from diarrhea and UTI. Will start her on IV fluids. Likely acute tubular necrosis. The patient does have baseline creatinine of around 1.5, with chronic kidney disease stage III. Follows with Dr. Wynelle LinkKolluru as outpatient. The patient is making urine, follow urine output.   3.  Fall, syncope versus presyncope, likely from the dehydration. Will get an EKG, which has not been done yet, but will not get any cardiac enzymes or echocardiogram, as we are not going to pursue any aggressive stress test or cath.    4. Acute encephalopathy secondary to UTI.  The patient will be on fall precautions.   5.  Diarrhea. Will get a clostridium difficile and stool white blood cells. Could be gastroenteritis. This is causing dehydration. Will treat with Imodium if the stool tests are negative.   6.  Hypertension. Continue home medications.   7.  Deep venous thrombosis prophylaxis. Heparin.   Code status:  FULL CODE.   Time spent today on this case was 55 minutes, with more than 50% of the time spent in coordination of care.    ____________________________ Molinda BailiffSrikar R. Brandie Lopes, MD srs:mr D: 05/02/2012 18:58:00 ET T: 05/02/2012 20:19:18 ET JOB#: [id number]  cc: Wardell HeathSrikar R. Elpidio AnisSudini, MD, <Dictator> Orie FishermanSRIKAR R Ayiden Milliman MD ELECTRONICALLY SIGNED 05/07/2012 19:47

## 2014-06-04 NOTE — H&P (Signed)
PATIENT NAME:  [name], [name] MR#:  [id number] DATE OF BIRTH:  1915/07/16  DATE OF ADMISSION:  07/23/2012  PRIMARY CARE PHYSICIAN:  Nonlocal.   REFERRING PHYSICIAN:  Dr. Chaney Mallingavid Yao.  CHIEF COMPLAINT:  Dizziness and frequent falls.   HISTORY OF PRESENT ILLNESS:  The patient is a 81 year old pleasant Caucasian female with a past medical history of chronic renal insufficiency stage 3, dementia, hypertension, hypothyroidism, is brought into the Emergency Room after she sustained a fall.  In the ER, the patient had a left hip x-ray, pelvic x-ray and left humeral x-ray with no acute findings.  She had a V-shaped laceration on the left forearm and on the shoulder which was cleaned and all x-rays were normal with no fractures.  The patient has received 500 mL of fluid bolus.  The patient denies any chest pain or shortness of breath, but as she is demented could not elicit much history from her.  Hospitalist team is called to admit the patient for acute on chronic renal insufficiency, dehydration and hyperkalemia.  The patient denies any chest pain or shortness of breath.  The patient's son is at bedside and he is reporting that the patient is chronically dizzy and lately she is falling frequently.  She has a walker and wheelchair at home, but the patient forgets to use them.  The patient's son who is also medical power of attorney is concerned that the patient is falling frequently.   PAST MEDICAL HISTORY:  Hypertension, hypothyroidism and anxiety with depression, history of breast cancer, status post urostomy.   PAST SURGICAL HISTORY:  Urostomy for bladder cancer, cataract surgery, esophageal stricture status post dilatation.   ALLERGIES:  The patient is allergic to [facility] AND SULFA.   PSYCHOSOCIAL HISTORY:  According to the old records and son at bedside, the patient does not smoke, drink alcohol.  Denies any illicit drug usage.   FAMILY HISTORY:  Dad deceased with tuberculosis.  Mother died at old  age.     REVIEW OF SYSTEMS:  The patient is demented.  Because of that, review of systems is unobtainable.   PHYSICAL EXAMINATION: VITAL SIGNS:  Temperature 98.5, pulse 65 to 72, respirations 16 to 20, blood pressure is 173/77.  Pulse ox 94% on room air.  GENERAL APPEARANCE:  Not under acute distress.  Moderately built and thin.  HEENT:  Normocephalic, atraumatic.  Pupils are equally reactive to light and accommodation.  No scleral icterus.  No conjunctival injection.  No sinus tenderness.  No postnasal drip.  No pharyngeal exudates.  NECK:  Supple.  No JVD.  No thyromegaly.  No lymphadenopathy.  LUNGS:  Clear to auscultation bilaterally.  No anterior chest wall tenderness on palpation.  No accessory muscle usage.  CARDIAC:  S1, S2 normal.  Regular rate and rhythm.  No murmurs. GASTROINTESTINAL:  Soft.  Bowel sounds are positive in all four quadrants.  Nontender, nondistended.  No masses felt.  No hepatosplenomegaly.  NEUROLOGIC:  Awake, alert and oriented to person.  Motor and sensory grossly intact.  Reflexes are 2+.  MUSCULOSKELETAL:  No joint effusion, tenderness or erythema.   SKIN:  Warm to touch.  Normal turgor.  No rashes.  No lesions.  PSYCHIATRIC:  Normal mood and affect.  LABORATORY AND IMAGING STUDIES:  Left hip x-ray, pelvis, left forearm and humerus are within normal range with no fractures.  Urinalysis yellow in color, cloudy in appearance, glucose bilirubin, ketones are negative.  Specific gravity 1.014.  WBC 7.5, hemoglobin 10.5, hematocrit 31.8,  platelets 246, troponin less than 0.02, total protein 7.1, albumin 3.2, bilirubin total 0.1, alkaline phosphatase 128, AST and ALT are within normal range.  Glucose 112, BUN 54, creatinine 2.86, sodium is 138, potassium 5.2, chloride 126, GFR 13, anion gap is 8, serum osmolality 291, calcium serum is 9.0.  Chest x-ray, no acute findings.  A 12-lead EKG, normal sinus rhythm at 65 beats per minute, left anterior fascicular block is present.    ASSESSMENT AND PLAN:  A 81 year old female coming from a rest home with a long history of dizziness and recent history of frequent falls, will be admitted with the following assessment and plan.  1.  Acute kidney injury on chronic kidney disease, stage 3.  Plan is to continue IV fluids and hold ACE inhibitors.  2.  Dehydration.  We will provide her IV fluids.  3.  Hyperkalemia.  No changes in the EKG are noticed.  4.  Hypertension.  Resume home medication.  5.  Hypothyroidism.  Resume Synthroid.  6.  A PT evaluation for frequent falls is ordered.  7.  We will provide her gastrointestinal and deep vein thrombosis prophylaxis.   Total time spent on admission is 50 minutes.     ____________________________ Ramonita Lab, MD ag:ea D: 07/24/2012 00:11:23 ET T: 07/24/2012 00:41:15 ET JOB#: [id number]  cc: Ramonita Lab, MD, <Dictator> Ramonita Lab MD ELECTRONICALLY SIGNED 07/25/2012 6:40

## 2014-06-04 NOTE — Discharge Summary (Signed)
PATIENT NAME:  [name], [name] MR#:  [id number] DATE OF BIRTH:  12/28/1915  DATE OF ADMISSION:  07/23/2012 DATE OF DISCHARGE:  07/25/2012  PRIMARY CARE PHYSICIAN:  [facility].  The patient is being discharged to the [facility].   PRESENTING COMPLAINT:  Weakness.   DISCHARGE DIAGNOSES: 1.  Acute on chronic renal failure due to dehydration, improved.  2.  Hypertension.  3.  Advanced dementia.   CODE STATUS:  NO CODE, DO NOT RESUSCITATE.   MEDICATIONS: 1.  Atenolol 25 mg daily at bedtime.  2.  Remeron 7.5 mg at bedtime.  3.  Hydralazine 25 mg twice daily.  4.  Aspirin 81 mg daily.  5. Escitalopram 20 mg daily.  6.  Donepezil 5 mg at bedtime.  7.  Vitamin B12 1000 mcg by mouth daily.  8.  RisaQuad oral capsule, 1 by mouth daily.  9.  Namenda 28 mg extended release by mouth daily.  10.  Zofran 4 mg every six hours as needed.  11.  Lisinopril 5 mg daily.  12.  Simvastatin 10 mg daily.  13.  Tramadol 50 mg once a day.  14.  Tramadol 50 mg 3 times a day as needed.  15.  Levothyroxine 50 mcg by mouth daily.   DIET:  No salt added, regular.   Hospice to follow.   The patient will need serum TSH to be checked in three weeks and results to be followed by PCP.   LABORATORY DATA AT DISCHARGE:  Creatinine is 1.48, sodium 144, potassium is 4.2, chloride is 110, bicarb is 27.  H and H is 9.5 and 27.8.  White count is 7.7, platelet count is 211, MCV is 94.  Magnesium 1.8.  TSH was 0.12.  UA negative for UTI.  Urine culture mixed bacterial organism.  Cardiac enzymes negative.    BRIEF SUMMARY OF HOSPITAL COURSE:   1.  [name] is a 81 year old Caucasian female with advanced dementia, comes in from the [facility] with weakness and decreased mentation.  The patient was admitted with acute kidney injury on chronic kidney disease stage 3.  She received IV fluids.  Her ACE inhibitors were held which were resumed after creatinine was stabilized at 1.4 date of discharge.   Her creatinine on admission was 2.8, received IV fluids, felt much better.   2.  Dehydration, resolved after IV fluids.  By mouth fluids were encouraged.  3.  Hyperkalemia due to hydration and renal failure.  No EKG changes were noted.  Potassium did improve.  4.  Hypertension.  Blood pressure meds initially were held since blood pressure was on the lower side due to dehydration, however resumed prior to discharge, since remained stabilized.  5.  Hypothyroidism.  Synthroid dose was decreased to 50 mcg.  TSH will be checked in four weeks and dose adjusted by PCP.  6.  PT evaluation for frequent falls.  [facility]  7.  Family requested hospice screen.  The patient was seen by hospice R.N. in the hospital and [facility]  Hospital stay otherwise remained stable.   TIME SPENT:  40 minutes.     ____________________________ Wylie HailSona A. Allena KatzPatel, MD sap:ea D: 07/26/2012 13:03:39 ET T: 07/27/2012 06:48:28 ET JOB#: [id number]  cc: Inaki Vantine A. Allena KatzPatel, MD, <Dictator> Willow OraSONA A Avangeline Stockburger MD ELECTRONICALLY SIGNED 08/06/2012 14:18

## 2014-06-06 NOTE — Discharge Summary (Signed)
PATIENT NAME:  [name], [name] MR#:  [id number] DATE OF BIRTH:  03-23-1915  DATE OF ADMISSION:  07/23/2011 DATE OF DISCHARGE:  07/24/2011  ADMISSION DIAGNOSIS: Syncope.   DISCHARGE DIAGNOSES:  1. Syncope of unclear etiology, possibly due to dehydration.  2. Acute renal failure secondary to dehydration.  3. Hypothyroidism with a low TSH.  4. Accelerated hypertension.  5. History of bladder cancer with chronic Foley.   PERTINENT LABORATORIES AT DISCHARGE: White blood cells 4.3, hemoglobin 9.8, hematocrit 29.9, platelets 105, sodium 143, potassium 4.7, chloride 114, bicarb 20, BUN 43, creatinine 1.57, glucose 86. Troponins x3 are negative. TSH is 0.155. Hemoglobin A1c 6.0. Renal ultrasound showed no evidence of hydronephrosis.   HOSPITAL COURSE: The patient is a very pleasant 81 year old female who presented with syncope. For further details, please refer to Dr. Urban GibsonGupta's history and physical.  1. Syncope of unclear etiology. The patient had a staring episode and froze, unresponsive for 15 to 20 seconds witnessed by her daughter. No seizure-like activity or other neurological deficits. Could be secondary to dehydration or hypertension but her blood pressure actually was not elevated in the ER. Unlikely TIA but possibly could be due to orthostasis. Her troponins were negative. No acute events during hospitalization.  2. Hypothyroidism. Her TSH was low. We decreased her Synthroid dose and will need TSH in six weeks.  3. Hypertension, accelerated. Since we took the patient off of her ACE inhibitor, she will be discharged with hydralazine and should have daily and then weekly blood pressure checks. 4. Acute renal failure from dehydration and ACE inhibitor. We discontinued ACE inhibitor use as well as NSAIDs. Continue Atenolol. Her creatinine has improved with IV fluids so we suspect this was dehydration. 5. Bladder cancer with chronic contamination secondary to chronic Foley.   DISCHARGE MEDICATIONS:   1. Lexapro 10 mg daily.  2. Trimethoprim 100 mg daily.  3. Atenolol 25 mg daily.  4. Calcium carbonate 600 mg daily.  5. Chondroitin/glucosamine 200/250 mg daily.  6. Centrum Silver daily.  7. Acetaminophen 325 2 tablets q.4 hours p.r.n. pain.  8. Xanax 0.25 mg q.8 hours p.r.n. anxiety.  9. Aspirin 81 mg daily.  10. Remeron 15 mg 0.5 mg at bedtime.  11. Namenda 5 mg b.i.d.  12. Synthroid 50 mcg daily.  13. Hydralazine 25 mg b.i.d.   DISCHARGE DIET: Low sodium.   DISCHARGE ACTIVITY: As tolerated.   DISCHARGE FOLLOW-UP: Follow-up in one week with [facility]. The patient will need daily blood pressure and then weekly. Thyroid test to be repeated in 4 to 6 weeks.   TIME SPENT: 35 minutes.   ____________________________ Janyth ContesSital P. Juliene PinaMody, MD spm:drc D: 07/24/2011 13:11:23 ET T: 07/24/2011 13:30:48 ET JOB#: [id number]  cc: Alaia Lordi P. Juliene PinaMody, MD, <Dictator> [facility] [place] Wingard P Shameeka Silliman MD ELECTRONICALLY SIGNED 07/25/2011 12:27

## 2014-06-06 NOTE — Discharge Summary (Signed)
PATIENT NAME:  [name], [name] MR#:  [id number] DATE OF BIRTH:  1915-05-22  DATE OF ADMISSION:  06/28/2011 DATE OF DISCHARGE:  06/29/2011  For a detailed note, please take a look at the history and physical done by Dr. Enid Baasadhika Kalisetti on admission.  DISCHARGE DIAGNOSES: 1. Acute renal failure, resolved with IV fluids. 2. Syncope, likely related to dehydration. 3. Hypovolemia.  4. Depression/anxiety. 5. Hypertension. 6. Hypothyroidism.  7. Osteoporosis.   DIET: The patient was discharged on a low sodium diet.   ACTIVITY: As tolerated.   DISCHARGE FOLLOWUP: Followup with [facility].   DISCHARGE MEDICATIONS:  1. Lisinopril 5 mg daily.  2. Lexapro 10 mg daily.  3. Aspirin 81 mg daily.  4. Trimethoprim 100 mg daily.  5. Atenolol 25 mg daily.  6. Calcium 1 tab daily. 7. Synthroid 88 mcg daily.  8. Chondroitin glucosamine one cap daily.  9. Centrum multivitamin daily.  10. Diclofenac 75 mg twice a day. 11. Tylenol 650 mg every four hours p.r.n.  12. Xanax 0.25 mg every 8 hours as needed. 13. Remeron 7.5 mg at bedtime.   CONSULTANTS: Ned GraceNancy Phifer, MD - Palliative Care.   PERTINENT STUDIES: CT scan of the head done without contrast showed no acute intracranial process.   Chest x-ray done on admission showed no acute cardiopulmonary disease.   X-ray of the right wrist showed no fracture.   X-ray of the right thumb showed no acute bony abnormalities, arthritic change in the IP joint of the thumb.   HOSPITAL COURSE: This is a 81 year old female with medical problems as mentioned above who presented to the hospital due to nausea, poor appetite, and a syncopal episode.  1. Syncope: The likely source of her syncope was probably dehydration. She was admitted with acute renal failure and dehydration. She was hydrated with IV fluids. She was admitted also on off unit telemetry and had three sets of cardiac markers checked which were negative. She had no alarms on  telemetry. Her CT of the head was negative. Therefore, the likely source of her syncope is likely dehydration which has now resolved after some IV fluids.  2. Acute renal failure: This was likely secondary to dehydration. The patient's ACE inhibitor was held. She was hydrated with IV fluids. Her creatinine since then has improved and now resolved.  3. Depression: The patient apparently has had about 4 to 5 family members pass away in the past six months. She continues to have a poor appetite and is severely depressed. A palliative care consult was obtained. The patient is already on Lexapro for depression. They added some Remeron to help her with her appetite. She apparently ate a pretty good breakfast on the day of discharge. The family is sending her back to assisted living and will continue follow-up as an outpatient.  4. Hypertension: The patient remained hemodynamically stable. Her lisinopril was held due to renal failure. She can now resume that along with her atenolol upon discharge.  5. Hypothyroidism: The patient was maintained on her Synthroid. She will resume that.  6. Osteoporosis: The patient will resume her diclofenac and her chondroitin glucosamine supplements.       CODE STATUS: FULL CODE.   DISPOSITION: She was discharged back to her assisted living facility.  TIME SPENT: 35 minutes. ____________________________ Rolly PancakeVivek J. Cherlynn KaiserSainani, MD vjs:slb D: 06/29/2011 15:12:01 ET T: 06/30/2011 13:37:48 ET JOB#: [id number]  cc: Rolly PancakeVivek J. Cherlynn KaiserSainani, MD, <Dictator> [facility] [place] SirenVIVEK J SAINANI MD ELECTRONICALLY SIGNED 07/05/2011 15:12

## 2014-06-06 NOTE — H&P (Signed)
PATIENT NAME:  [name], [name] MR#:  [id number] DATE OF BIRTH:  03/23/1915  DATE OF ADMISSION:  07/23/2011  PRIMARY CARE PHYSICIAN: [facility]    CHIEF COMPLAINT: History was mainly obtained from the patient's daughter. The patient is a limited historian. The patient had an episode of unresponsiveness today. As per the daughter she froze up on her.   HISTORY OF PRESENT ILLNESS: The patient is a 81 year old female who is a resident of [facility] at assisted living. She has history of hypertension, hypothyroidism, history of bladder cancer status post urostomy, history of anxiety/depression. She was recently admitted to the hospital on 06/28/2011 and discharged on 06/29/2011. At that time she had a syncopal episode. She was also found to have renal failure that resolved with fluids. As per the daughter, since her discharge she is not back to her baseline. Normally she is quite functional with a clear mind. As per the daughter, she has not been feeling well for the last few days. She is a little bit weak, slightly dizzy, and today they took her to [place]. When they got her out of the car she froze on her. That is the term the daughter uses. They lowered her to the wheelchair and she was unresponsive for a few seconds, 15 to 20 seconds, and then she woke up. There was no seizure activity noted. There was no postictal state per se. In the Emergency Room her work-up showed that she had a creatinine of 2.19 with BUN of 53. At discharge last time her creatinine was 1.27 with BUN of 36. She denies any decrease in her urinary output. She said she drinks fluids at the assisted living. She denies any chest pain or shortness of breath right now or any abdominal pain, nausea or vomiting. She is being admitted for syncope, acute renal failure, maybe dehydration.   REVIEW OF SYSTEMS: Review of systems is limited. No fever but complaining of weakness. No acute change in vision. No headache. Has  mild dizziness. No cough. No dyspnea. No chest pain. No nausea, vomiting, diarrhea, abdominal pain. She has a urostomy. She has thyroid problems. No history of anemia. No bleeding from any site. No rash. No joint pains. No focal numbness or weakness. She has history of anxiety.   PAST MEDICAL HISTORY:  1. Hypertension. 2. Hypothyroidism. 3. History of bladder cancer, status post urostomy. 4. History of anxiety/depression. 5. Last admission on 06/28/2011, discharged on 06/29/2011 for syncope and acute renal failure. Her baseline creatinine is like 1.27 with eGFR of 36.   PAST SURGICAL HISTORY:  1. Urostomy for bladder surgery. 2. Cataract surgery.  3. Esophageal stricture status post dilatation.   DRUG ALLERGIES: Sulfa and Phenergan.   HOME MEDICATIONS:  1. Acetaminophen 650 mg q.4 hours p.r.n.  2. Alprazolam 0.25 mg q.8 hours p.r.n.  3. Aspirin 81 mg daily.  4. Atenolol 25 mg daily.  5. Calcium carbonate 600 mg in the morning. 6. Centrum Silver daily.  7. Chondroitin/glucosamine daily.  8. Diclofenac sodium 75 mg b.i.d. as needed.  9. Escitalopram 10 mg daily.  10. Levothyroxine 88 mcg daily. 11. Lisinopril 5 mg daily. 12. Mirtazapine 7.5 mg at bedtime. 13. Namenda 5 mg b.i.d.  14. Trimethoprim 100 mg daily.   SOCIAL HISTORY: She lives at [facility] at assisted living. No smoking or alcohol use.   FAMILY HISTORY: Her dad died of tuberculosis as he was working in the mines. Mother died in her 82's.  PHYSICAL EXAMINATION:   VITAL SIGNS: When she presented to the Emergency Room, temperature 98.1, heart rate 64, respiratory rate 18, blood pressure 139/57, saturating 97% on room air.   GENERAL: She is an elderly Caucasian female, thin built. She is comfortably lying in bed supine, no acute distress.   HEENT: Bilateral pupils are equal, reactive to light. Extraocular muscles are intact. No scleral icterus. No conjunctivitis. Oral mucosa is moist. No pallor.   NECK: No  thyroid tenderness, enlargement, or nodule. Neck is supple. No masses, nontender. No adenopathy. No JVD. No carotid bruit.   CHEST: She has minimal crackles at the right base, otherwise clear lung fields. Normal respiratory effort. Not using accessory muscles of respiration.   HEART: Heart sounds are regular. No murmur. Good peripheral pulses. No lower extremity edema.   ABDOMEN: Soft, nontender. Normal bowel sounds. No hepatosplenomegaly. No bruit. No masses. The patient has a urostomy with a urostomy bag attached.   NEUROLOGIC: She is awake. She is alert. She knows that she is in the hospital. She knows her date of birth. She does not remember the month or the year. Her cranial nerves are intact. Moving all extremities against gravity. Power is greater than 4/5 in bilateral upper and lower extremities.   EXTREMITIES: No cyanosis. No clubbing.   SKIN: No rash. No lesions.   LABORATORY, DIAGNOSTIC, AND RADIOLOGICAL DATA: White count 6, hemoglobin 10.7, platelet count 215,000. BMP sodium 138, potassium 4.9, BUN 53, creatinine 2.19, glucose 153. Her last creatinine was 1.27 and BUN of 36 at discharge. Urinalysis ketones negative, glucose negative, nitrite negative, leukocyte esterase trace, WBCs 23.   On EKG she has a bifascicular block with left axis deviation and a right bundle branch block, unchanged from prior EKGs.   IMPRESSION:  1. Acute renal failure with underlying chronic kidney disease with azotemia, may be prerenal. 2. Syncope, may be secondary to dehydration.  3. Acute renal failure.  4. Hypertension.  5. History of bladder cancer, status post urostomy.  6. Hypothyroidism.  7. History of anxiety/depression. 8. Hyperglycemia.   PLAN: This is a 81 year old female who has history of hypertension and history of bladder cancer status post urostomy. She was admitted last month because of syncope thought to be secondary to dehydration and acute renal failure. Her creatinine responded  to fluids. This time she again comes with similar circumstances. She was unresponsive for a few seconds. Her creatinine is noted to be 2.19 with BUN of 53 with an eGFR of 19. She has some baseline chronic kidney disease. She is not on any diuretic at [facility]. She is on diclofenac p.r.n. which I'm going to discontinue. This should be discontinued at discharge also. I'm going to hold her lisinopril at this time. Will give her some gentle hydration. We're going to watch her renal function. I'm going to check an ultrasound of her kidneys also and will also check an echo considering that she had this syncope last month and she had a questionable syncopal episode this time also. Will watch her on telemetry. She has some elevated BNP which is most likely secondary to renal failure. I told the daughter that probably her renal function should be monitored more closely at [facility], probably they can do it like every week initially to make sure that it is stable. Will also get a PT evaluation on her. She does not appear to have any neurological deficits at this time.   TIME SPENT WITH ADMISSION AND COORDINATION OF  CARE: 55 minutes.   ____________________________ Mena Pauls, MD ag:drc D: 07/23/2011 19:13:33 ET T: 07/24/2011 06:31:04 ET JOB#: [id number]  cc: Mena Pauls, MD, <Dictator> [facility] Mena Pauls MD ELECTRONICALLY SIGNED 07/31/2011 12:41

## 2014-06-06 NOTE — H&P (Signed)
Mccarthy NAME:  [name] Mccarthy, [name] Mccarthy DATE OF BIRTH:  [birth year]  DATE OF ADMISSION:  06/28/2011  ADMITTING PHYSICIAN: Enid Baasadhika Manjot Hinks, MD  PRIMARY CARE PHYSICIAN: Dr. Redmond Schoolripp - [facility]  CHIEF COMPLAINT: Syncope.   HISTORY OF PRESENT ILLNESS: [name] Mccarthy is a 81 year old elderly Caucasian female with past medical history significant for hypertension, depression, anxiety, and hypothyroidism brought in from assisted living facility secondary to syncopal episode that he had this morning. [name] Mccarthy is hard of hearing and also appears very weak so his daughter at bedside gives most of [name] history. According to [name] daughter, [name] Mccarthy is very functional and walks around by herself at home, but she has been lately depressed since last Thanksgiving because of four deaths in [name] family. Sunday one of [name] Mccarthy's daughters was hospitalized secondary to cardiac condition and [name] Mccarthy got anxious, upset and has been nauseous and did not want to eat or drink. All day yesterday she has been having episodes of lightheadedness, but this morning she woke up and was trying to walk and fainted on [name] floor. By [name] time her daughter came, [name] Mccarthy was up and so was brought to [name] hospital. In [name] ER, she appears dehydrated and found to be in acute renal failure so she is being admitted for work-up of syncope. No recent fever, chills, nausea, vomiting, diarrhea, congestion, cold or other symptoms. [name] Mccarthy wears a urinary bag secondary to urostomy for bladder cancer and that was changed yesterday. [name] Mccarthy denies any other urinary complaints.   PAST MEDICAL HISTORY:  1. Hypertension.  2. Depression and anxiety.  3. History of bladder cancer status post urostomy.  4. Hypothyroidism.   PAST SURGICAL HISTORY:  1. Urostomy done for bladder surgery and there is a urinary bag.  2. Cataract surgery. 3. Esophageal stricture status post dilatation.  DRUG ALLERGIES: Sulfa and  Phenergan.   MEDICATIONS: 1. Tylenol 325 mg 1 to 2 tablets p.o. every four hours p.r.n.  2. Xanax 0.25 mg every eight hours p.r.n.  3. Aspirin 81 mg p.o. daily.  4. Atenolol 25 mg p.o. daily.  5. Calcium carbonate 600 mg p.o. daily.  6. Centrum Silver 1 tablet p.o. daily.  7. Chondroitin glucosamine 200 mg/250 mg 1 tablet p.o. daily.  8. Diclofenac 75 mg p.o. twice a day. 9. Lexapro 10 mg p.o. daily.  10. Levothyroxine 88 mcg p.o. daily. 11. Lisinopril 5 mg p.o. daily.  12. Trimethoprim 100 mg p.o. daily.   SOCIAL HISTORY: She lives in an assisted living facility, very functional so far. No history of smoking or alcohol use.   FAMILY HISTORY: Dad died young from tuberculosis as he was working in [name] mines. Mother died in her 2180s.  REVIEW OF SYSTEMS: CONSTITUTIONAL: No fever. Positive for fatigue and weakness. No recent weight loss or weight gain. EYES: Status post cataract surgery. Currently no blurred vision, double vision, glaucoma, or inflammation. ENT: No tinnitus or ear pain. Positive for hearing loss. No epistaxis or discharge. RESPIRATORY: No cough, wheeze, hemoptysis, or chronic obstructive pulmonary disease. CARDIOVASCULAR: No chest, orthopnea, edema, arrhythmias, or palpitations. Positive for syncope. GI: Positive for nausea. No vomiting or diarrhea. No abdominal pain. No hematemesis or melena. History of dysphagia secondary to esophageal stricture status post dilatation. GENITOURINARY: No dysuria or hematuria. Has a urostomy bag. ENDOCRINE: No polyuria, nocturia, thyroid problems, heat or cold intolerance. HEMATOLOGY: No anemia, easy bruising, or bleeding. SKIN: No acne, rash, or lesions. MUSCULOSKELETAL: No neck, back, or shoulder  pain. Positive for arthritis. NEUROLOGIC: No numbness, weakness, cerebrovascular accident, transient ischemic attack, or seizures. PSYCHOLOGICAL: No anxiety, insomnia, or depression.      PHYSICAL EXAMINATION:   VITAL SIGNS: Temperature 98.4 degrees  Fahrenheit, pulse 71, respirations 20, blood pressure 100/46, and pulse oximetry 94% on room air.   GENERAL: Thin-built, well nourished female lying in bed, not in any acute distress.   HEENT: Normocephalic, atraumatic. Pupils equal, round, and reacting to light. Anicteric sclerae. Extraocular movements intact. Oropharynx clear without erythema, mass, or exudates. Dry mucous membranes.   NECK: Supple. No thyromegaly, JVD, or carotid bruits. No lymphadenopathy.   LUNGS: Moving air bilaterally. No wheeze or crackles. No use of accessory muscles for breathing.   HEART: S1 and S2 regular rate and rhythm. 3/6 systolic murmur. No rubs or gallops.   ABDOMEN: Soft, nontender, and nondistended. Urostomy bag in [name] right lower quadrant draining clear urine is present. Normal bowel sounds.   EXTREMITIES: No pedal edema. No clubbing or cyanosis. 2+ dorsalis pedis pulses palpable bilaterally.   SKIN: No acne, rash, or lesions.   NEUROLOGIC: Cranial nerves II through XII grossly intact. No focal motor or sensory deficits.   PSYCHOLOGICAL: [name] Mccarthy is awake, alert, and oriented.   LABS/STUDIES: WBC 7.9, hemoglobin 11.3, hematocrit 33.4, and platelet count 253.   Sodium 137, potassium 4.2, chloride 101, bicarbonate 30, BUN 38, creatinine 1.52, glucose 202, and calcium is 8.7.   ALT 21, AST 24, alkaline phosphatase 95, total bilirubin 0.4, and albumin 3.5. CK 48, CK-MB less than 0.1, and troponin less than 0.02.  Chest x-ray is showing no acute changes. Clear lung fields.   Right wrist x-ray is showing no fracture or other acute abnormality. Arthritic changes are present.   CT of [name] head done for syncope is showing no acute intracranial process.   Urinalysis is pending at this time.   EKG is showing normal sinus rhythm with right bundle branch block which is chronic and heart rate is 64.   ASSESSMENT AND PLAN: This is 81 year old female from an assisted living facility who is functional at  baseline who has been depressed lately with poor p.o. intake brought in after a syncopal episode.  1. Syncope, likely from dehydration and hypovolemia. We will admit to off unit telemetry under observation, start IV fluids, do neuro checks, and get an echocardiogram. Also physical therapy consult to see if she can go back to assisted living or will need higher level facility. If she is going back to assisted living, she probably might benefit from getting home health. Also check urinalysis to rule out any infection as [name] cause of nausea and poor intake. Also check TSH.  2. Acute renal failure, seems prerenal. Hold lisinopril for now, give fluids and also follow-up renal function in [name] morning.  3. Depression, grieving process, and failure to thrive. We will get palliative care consult. Physical therapy evaluation again as mentioned above and care management consult for discharge planning. Start on Megace for appetite stimulant. We will hold off on Remeron as she does not have any sleep problems at this time. She is already on Lexapro for her depression.  4. Hypertension. Hold lisinopril and also atenolol as blood pressure is borderline and she is hypovolemic. Give IV fluids. 5. History of bladder surgery status post urostomy. [name] bag has been changed by her daughter yesterday. Usually they change it once every week. She is on chronic antibiotic maintenance therapy with trimethoprim, which we will continue at this time.  6. DVT prophylaxis with TED stockings.       CODE STATUS: FULL CODE AT THIS TIME.  TIME SPENT ON ADMISSION: 50 minutes. ____________________________ Enid Baas, MD rk:slb D: 06/28/2011 15:21:08 ET T: 06/28/2011 16:14:09 ET JOB#: [id number]  cc: Enid Baas, MD, <Dictator> Dr[facility] [employer] - [facility] Enid Baas MD ELECTRONICALLY SIGNED 06/30/2011 9:08
